# Patient Record
Sex: Female | Born: 1975 | Race: White | Hispanic: No | Marital: Single | Smoking: Never smoker
Health system: Southern US, Community
[De-identification: ages and names within clinical notes are randomized; demographics above are authoritative.]

---

## 2006-05-19 ENCOUNTER — Emergency Department: Payer: Self-pay | Admitting: Emergency Medicine

## 2010-09-25 ENCOUNTER — Emergency Department: Payer: Self-pay | Admitting: *Deleted

## 2011-05-31 ENCOUNTER — Ambulatory Visit: Payer: Self-pay | Admitting: Family Medicine

## 2016-08-23 ENCOUNTER — Other Ambulatory Visit: Payer: Self-pay | Admitting: Student

## 2016-08-23 DIAGNOSIS — Z1231 Encounter for screening mammogram for malignant neoplasm of breast: Secondary | ICD-10-CM

## 2016-09-22 ENCOUNTER — Ambulatory Visit
Admission: RE | Admit: 2016-09-22 | Discharge: 2016-09-22 | Disposition: A | Discharge status: Home / Self Care | Payer: 59 | Source: Ambulatory Visit | Attending: Student | Admitting: Student

## 2016-09-22 DIAGNOSIS — Z1231 Encounter for screening mammogram for malignant neoplasm of breast: Secondary | ICD-10-CM | POA: Diagnosis not present

## 2016-09-29 ENCOUNTER — Inpatient Hospital Stay
Admission: RE | Admit: 2016-09-29 | Discharge: 2016-09-29 | Disposition: A | Discharge status: Home / Self Care | Payer: Self-pay | Source: Ambulatory Visit | Attending: *Deleted | Admitting: *Deleted

## 2016-09-29 ENCOUNTER — Other Ambulatory Visit: Payer: Self-pay | Admitting: *Deleted

## 2016-09-29 DIAGNOSIS — Z9289 Personal history of other medical treatment: Secondary | ICD-10-CM

## 2017-09-13 ENCOUNTER — Other Ambulatory Visit: Payer: Self-pay | Admitting: Student

## 2017-09-13 DIAGNOSIS — Z1231 Encounter for screening mammogram for malignant neoplasm of breast: Secondary | ICD-10-CM

## 2017-09-28 ENCOUNTER — Ambulatory Visit
Admission: RE | Admit: 2017-09-28 | Discharge: 2017-09-28 | Disposition: A | Discharge status: Home / Self Care | Payer: Managed Care, Other (non HMO) | Source: Ambulatory Visit | Attending: Student | Admitting: Student

## 2017-09-28 DIAGNOSIS — Z1231 Encounter for screening mammogram for malignant neoplasm of breast: Secondary | ICD-10-CM | POA: Diagnosis not present

## 2017-09-30 ENCOUNTER — Other Ambulatory Visit: Payer: Self-pay | Admitting: Student

## 2017-09-30 DIAGNOSIS — R928 Other abnormal and inconclusive findings on diagnostic imaging of breast: Secondary | ICD-10-CM

## 2017-10-05 ENCOUNTER — Ambulatory Visit
Admission: RE | Admit: 2017-10-05 | Discharge: 2017-10-05 | Disposition: A | Discharge status: Home / Self Care | Payer: Managed Care, Other (non HMO) | Source: Ambulatory Visit | Attending: Student | Admitting: Student

## 2017-10-05 DIAGNOSIS — R928 Other abnormal and inconclusive findings on diagnostic imaging of breast: Secondary | ICD-10-CM

## 2018-07-08 ENCOUNTER — Other Ambulatory Visit: Payer: Self-pay

## 2018-07-08 ENCOUNTER — Emergency Department
Admission: EM | Admit: 2018-07-08 | Discharge: 2018-07-08 | Disposition: A | Discharge status: Home / Self Care | Payer: Managed Care, Other (non HMO) | Attending: Emergency Medicine | Admitting: Emergency Medicine

## 2018-07-08 ENCOUNTER — Encounter: Payer: Self-pay | Admitting: Emergency Medicine

## 2018-07-08 DIAGNOSIS — R42 Dizziness and giddiness: Secondary | ICD-10-CM | POA: Insufficient documentation

## 2018-07-08 DIAGNOSIS — Z5321 Procedure and treatment not carried out due to patient leaving prior to being seen by health care provider: Secondary | ICD-10-CM | POA: Insufficient documentation

## 2018-07-08 LAB — BASIC METABOLIC PANEL
Anion gap: 8 (ref 5–15)
BUN: 19 mg/dL (ref 6–20)
CO2: 24 mmol/L (ref 22–32)
Calcium: 8.8 mg/dL — ABNORMAL LOW (ref 8.9–10.3)
Chloride: 104 mmol/L (ref 98–111)
Creatinine, Ser: 0.76 mg/dL (ref 0.44–1.00)
GFR calc Af Amer: >60 mL/min (ref >60)
GFR calc non Af Amer: >60 mL/min (ref >60)
Glucose, Bld: 100 mg/dL — ABNORMAL HIGH (ref 70–99)
Potassium: 3.8 mmol/L (ref 3.5–5.1)
Sodium: 136 mmol/L (ref 135–145)

## 2018-07-08 LAB — URINALYSIS, COMPLETE (UACMP) WITH MICROSCOPIC
Bacteria, UA: NONE SEEN
Bilirubin Urine: NEGATIVE
Glucose, UA: NEGATIVE mg/dL
Hgb urine dipstick: NEGATIVE
Ketones, ur: NEGATIVE mg/dL
Leukocytes,Ua: NEGATIVE
Nitrite: NEGATIVE
Protein, ur: NEGATIVE mg/dL
Specific Gravity, Urine: 1.014 (ref 1.005–1.030)
pH: 6 (ref 5.0–8.0)

## 2018-07-08 LAB — CBC
HCT: 40.7 % (ref 36.0–46.0)
Hemoglobin: 13.1 g/dL (ref 12.0–15.0)
MCH: 28.2 pg (ref 26.0–34.0)
MCHC: 32.2 g/dL (ref 30.0–36.0)
MCV: 87.5 fL (ref 80.0–100.0)
Platelets: 395 10*3/uL (ref 150–400)
RBC: 4.65 MIL/uL (ref 3.87–5.11)
RDW: 13.1 % (ref 11.5–15.5)
WBC: 12.1 10*3/uL — ABNORMAL HIGH (ref 4.0–10.5)
nRBC: 0 % (ref 0.0–0.2)

## 2018-07-08 LAB — TROPONIN I: Troponin I: <0.03 ng/mL (ref <0.03)

## 2018-07-08 NOTE — ED Notes (Signed)
Called for room, no answer

## 2018-07-08 NOTE — ED Notes (Signed)
Called for room, not in waiting room.  

## 2018-07-08 NOTE — ED Notes (Signed)
Called for VS, not in waiting room.

## 2018-07-08 NOTE — ED Triage Notes (Addendum)
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 Pt reports when she was lightheaded she was also sweaty and felt like her arms were heavy. Pt denies any pain at this time. Pt states the sxs started on Thursday.

## 2019-04-16 ENCOUNTER — Other Ambulatory Visit: Payer: Self-pay | Admitting: Student

## 2019-04-16 DIAGNOSIS — Z1231 Encounter for screening mammogram for malignant neoplasm of breast: Secondary | ICD-10-CM

## 2019-04-19 ENCOUNTER — Ambulatory Visit
Admission: RE | Admit: 2019-04-19 | Discharge: 2019-04-19 | Disposition: A | Discharge status: Home / Self Care | Payer: Managed Care, Other (non HMO) | Source: Ambulatory Visit | Attending: Student | Admitting: Student

## 2019-04-19 DIAGNOSIS — Z1231 Encounter for screening mammogram for malignant neoplasm of breast: Secondary | ICD-10-CM | POA: Insufficient documentation

## 2020-05-22 ENCOUNTER — Other Ambulatory Visit: Payer: Self-pay | Admitting: Student

## 2020-05-22 DIAGNOSIS — Z1231 Encounter for screening mammogram for malignant neoplasm of breast: Secondary | ICD-10-CM

## 2020-06-03 ENCOUNTER — Other Ambulatory Visit: Payer: Self-pay

## 2020-06-03 ENCOUNTER — Ambulatory Visit
Admission: RE | Admit: 2020-06-03 | Discharge: 2020-06-03 | Disposition: A | Discharge status: Home / Self Care | Payer: Managed Care, Other (non HMO) | Source: Ambulatory Visit | Attending: Student | Admitting: Student

## 2020-06-03 DIAGNOSIS — Z1231 Encounter for screening mammogram for malignant neoplasm of breast: Secondary | ICD-10-CM | POA: Insufficient documentation

## 2020-06-03 IMAGING — MG MM DIGITAL SCREENING BILAT W/ TOMO AND CAD
8 series · 8 of 24 positions shown · non-contrast
Comparison: Previous exam(s).

CLINICAL DATA: Screening.

EXAM:
DIGITAL SCREENING BILATERAL MAMMOGRAM WITH TOMOSYNTHESIS AND CAD
TECHNIQUE: Bilateral screening digital craniocaudal and mediolateral oblique
mammograms were obtained. Bilateral screening digital breast
tomosynthesis was performed. The images were evaluated with
computer-aided detection.

[L MLO synth-2D]
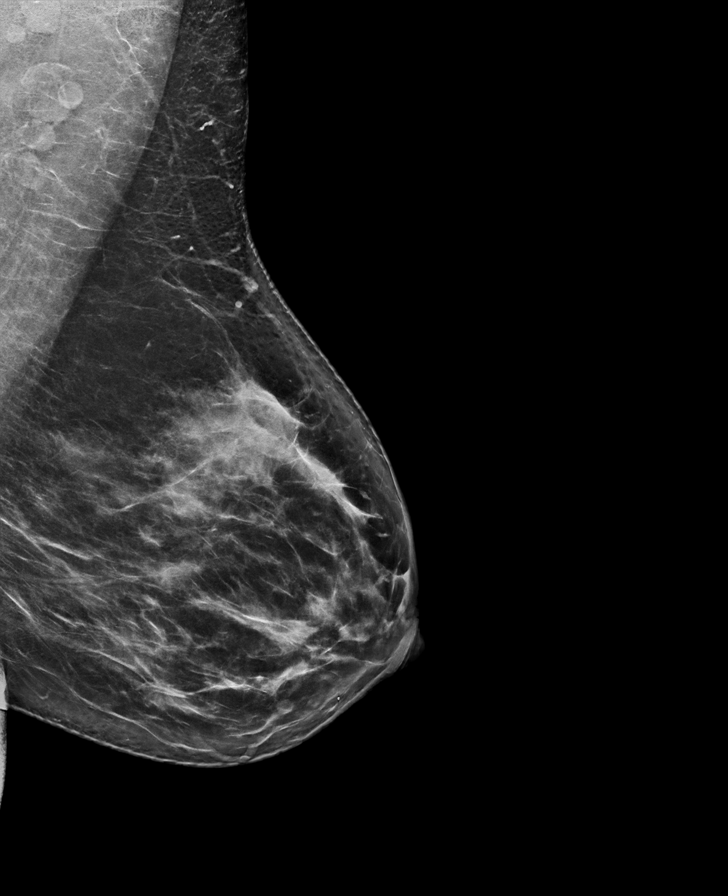

[R MLO synth-2D]
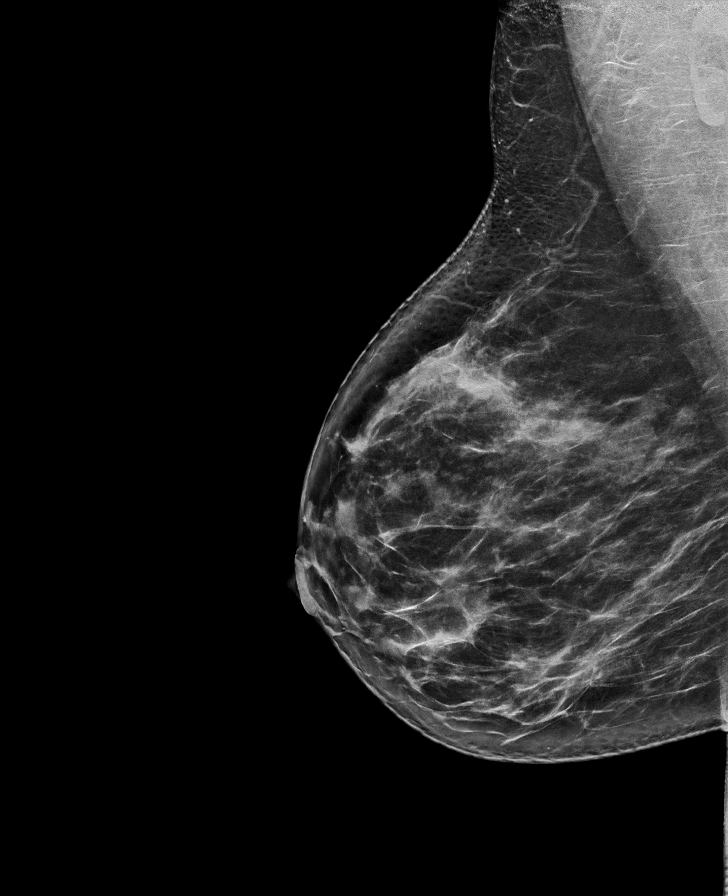

[R CC synth-2D]
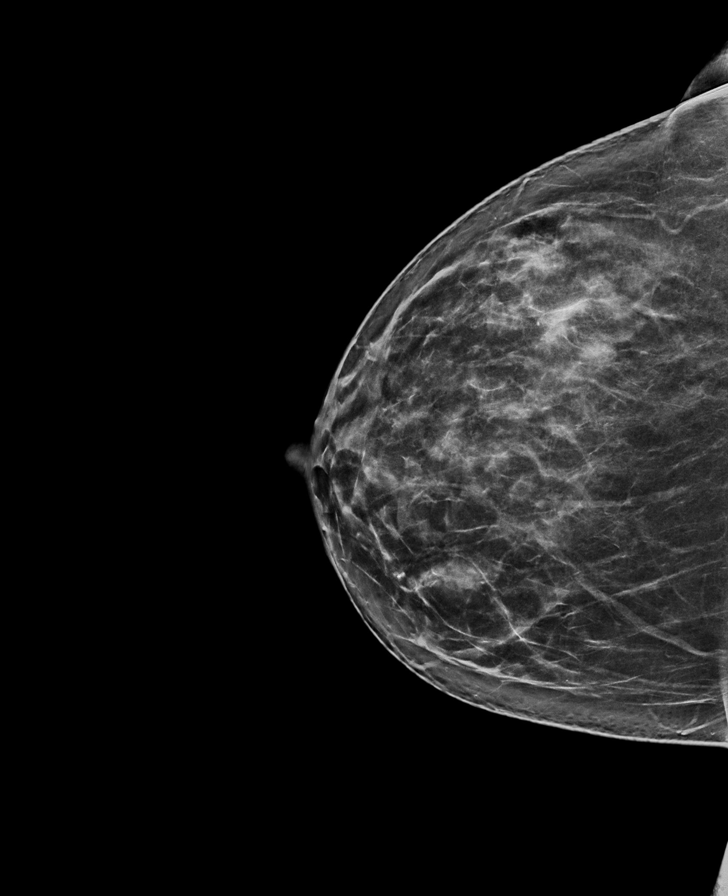

[L CC synth-2D]
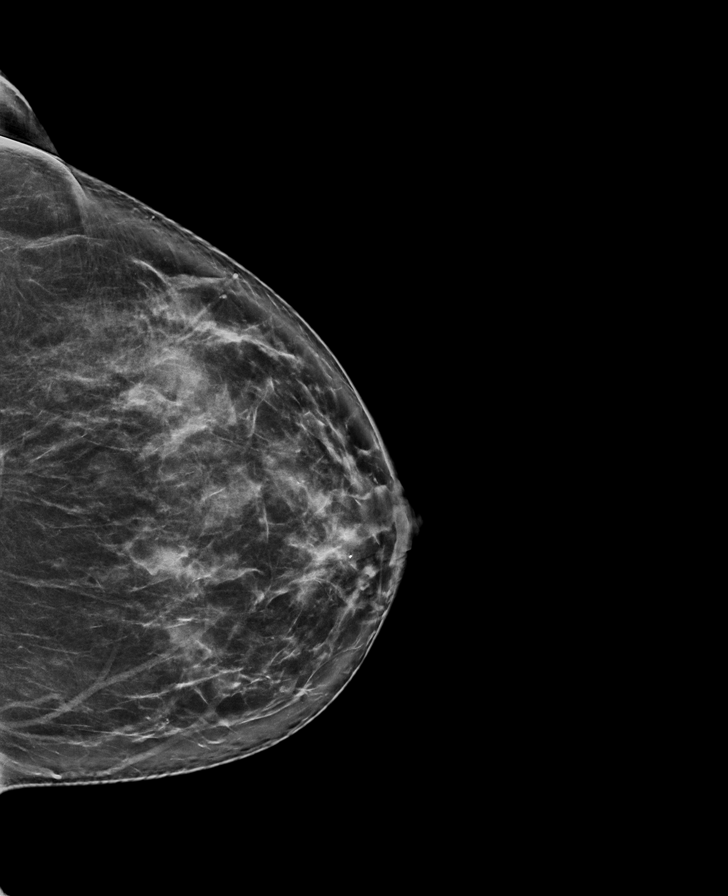

[R MLO tomo · tomo slice 43/85.0]
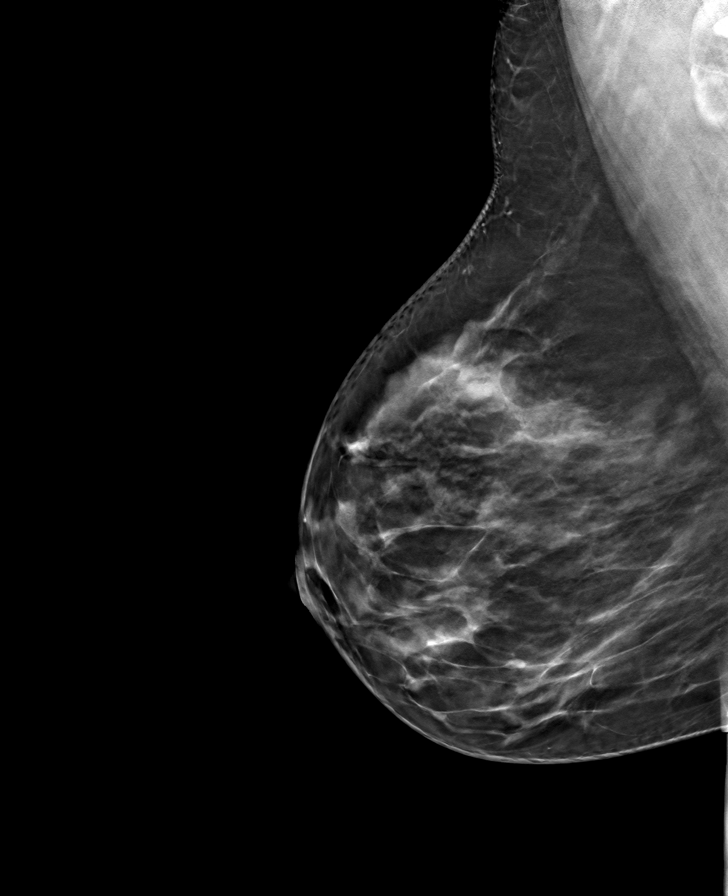

[R CC tomo · tomo slice 41/82.0]
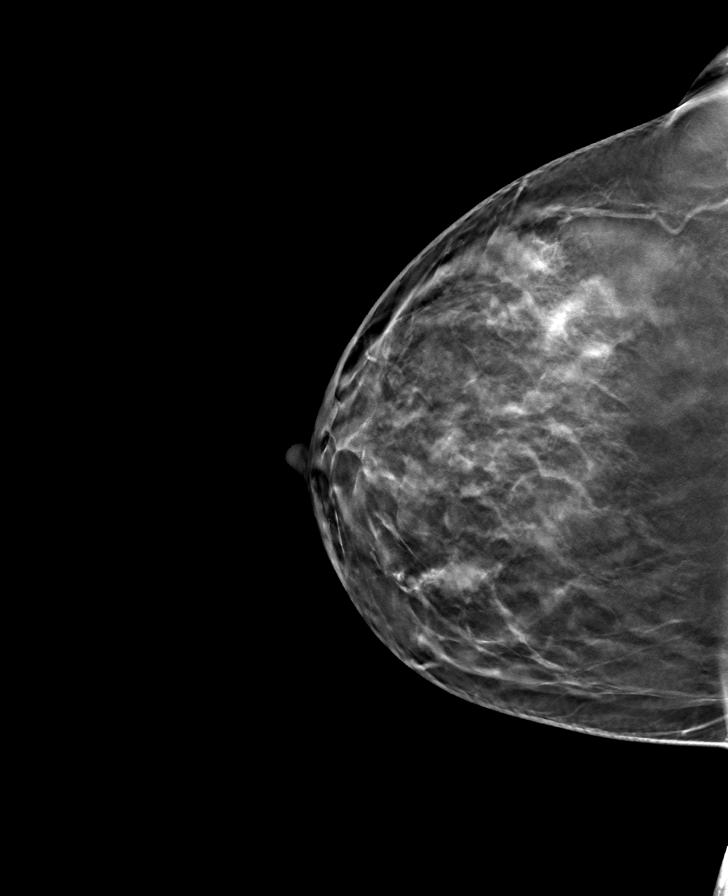

[L MLO tomo · tomo slice 43/85.0]
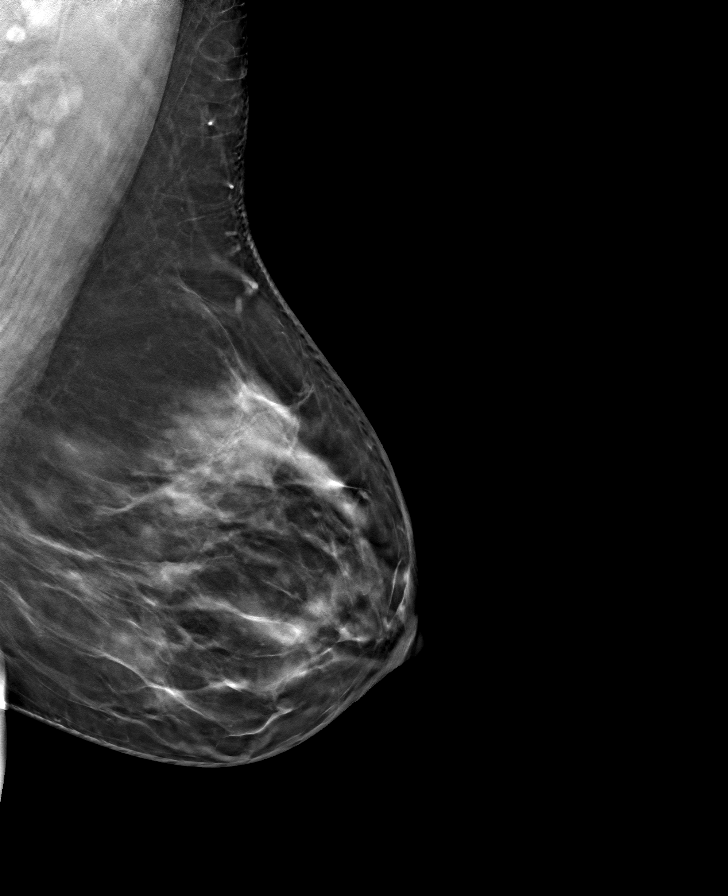

[L CC tomo · tomo slice 40/79.0]
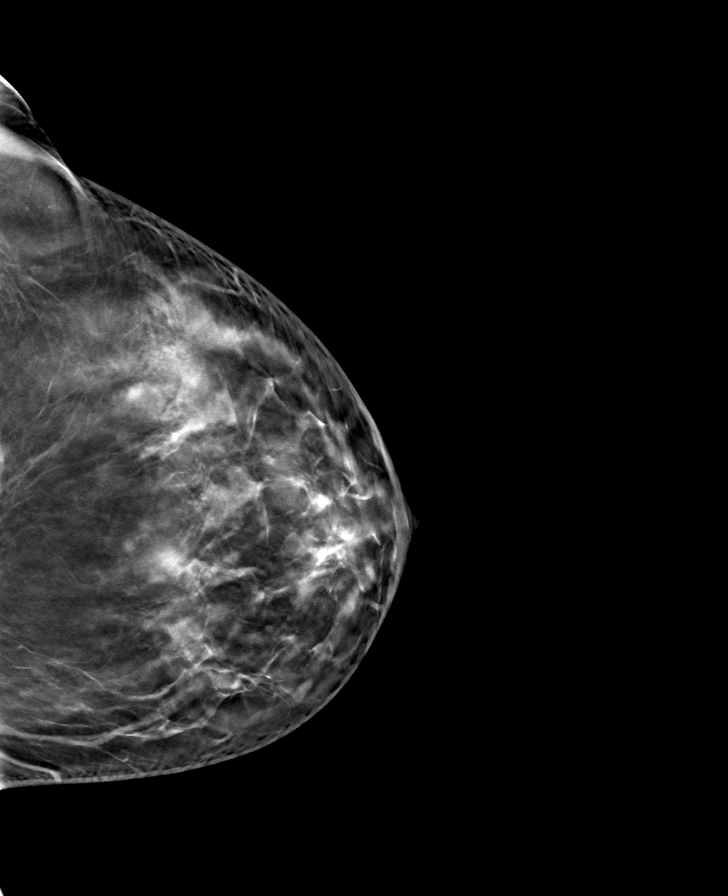

[8 of 24 positions shown; findings below may reference images not displayed]

ACR Breast Density Category c: The breast tissue is heterogeneously
dense, which may obscure small masses.
FINDINGS: There are no findings suspicious for malignancy. The images were
evaluated with computer-aided detection.
IMPRESSION: No mammographic evidence of malignancy. A result letter of this
screening mammogram will be mailed directly to the patient.

RECOMMENDATION:
Screening mammogram in one year. (Code:[6J])

BI-RADS CATEGORY  1: Negative.

## 2022-07-12 ENCOUNTER — Other Ambulatory Visit: Payer: Self-pay

## 2022-07-12 ENCOUNTER — Encounter: Payer: Self-pay | Admitting: Neurology

## 2022-07-12 DIAGNOSIS — R202 Paresthesia of skin: Secondary | ICD-10-CM

## 2022-07-26 ENCOUNTER — Ambulatory Visit: Payer: Managed Care, Other (non HMO) | Admitting: Neurology

## 2022-07-26 DIAGNOSIS — R202 Paresthesia of skin: Secondary | ICD-10-CM

## 2022-07-26 DIAGNOSIS — G5603 Carpal tunnel syndrome, bilateral upper limbs: Secondary | ICD-10-CM

## 2022-07-26 NOTE — Procedures (Signed)
[facility]  [address] [place], [address]  [place], [place] [address] Tel: [phone] Fax: [phone] Test Date:  07/26/2022  Patient: Linda Carpenter DOB: Nov 01, 1975 Physician: Jacquelyne Balint, MD  Sex: Female Height: 5\' 5"  Ref Phys: Margart Sickles, PA-C  ID#: [id number]   Technician:    History: This is a 47 year old female with numbness and tingling in her hands.  NCV & EMG Findings: Extensive electrodiagnostic evaluation of bilateral upper limbs shows: Right median sensory response shows prolonged distal peak latency (4.4 ms) and reduced amplitude (15 V). Left median-ulnar palmar sensory response shows prolonged median palmar distal peak latency (2.3 ms) and abnormal median-ulnar palmar latency difference (0.87 ms). Left median, bilateral ulnar, and bilateral radial sensory responses are within normal limits. Bilateral median (APB) and ulnar (ADM) motor responses are within normal limits. Chronic motor axon loss changes without accompanying active denervation changes are seen in the left first dorsal interosseous muscle. All other tested muscles are within normal limits with normal motor unit configuration and recruitment patterns.  Impression: This is an abnormal study. The findings are most consistent with the following: Evidence of bilateral median mononeuropathy at or distal to the wrist, consistent with carpal tunnel syndrome. The findings are moderate in degree electrically on the right and very mild in degree electrically on the left. No definitive electrodiagnostic evidence of a right or left cervical (C5-C8) motor radiculopathy. Screening studies for right or left ulnar or radial mononeuropathies are normal. Minimal chronic neurogenic changes isolated to the left first dorsal interosseous muscle are seen on needle examination, with no active or ongoing changes, that is of unclear significance. This finding is too limited in degree and distribution for diagnostic  purposes.    ___________________________ Jacquelyne Balint, MD    Nerve Conduction Studies Motor Nerve Results    Latency Amplitude F-Lat Segment Distance CV Comment  Site (ms) Norm (mV) Norm (ms)  (cm) (m/s) Norm   Left Median (APB) Motor  Wrist 2.3  < 3.9 10.3  > 6.0        Elbow 6.5 - 9.8 -  Elbow-Wrist 27.5 65  > 50   Right Median (APB) Motor  Wrist 3.5  < 3.9 8.4  > 6.0        Elbow 7.8 - 7.7 -  Elbow-Wrist 25 58  > 50   Left Ulnar (ADM) Motor  Wrist 1.40  < 3.1 9.7  > 7.0        Bel elbow 4.4 - 8.5 -  Bel elbow-Wrist 20 67  > 50   Ab elbow 5.9 - 8.4 -  Ab elbow-Bel elbow 10 67 -   Right Ulnar (ADM) Motor  Wrist 1.40  < 3.1 9.3  > 7.0        Bel elbow 4.4 - 8.7 -  Bel elbow-Wrist 20 67  > 50   Ab elbow 6.0 - 8.6 -  Ab elbow-Bel elbow 10 63 -    Sensory Sites    Neg Peak Lat Amplitude (O-P) Segment Distance Velocity Comment  Site (ms) Norm (V) Norm  (cm) (ms)   Left Median Sensory  Wrist-Dig II 3.2  < 3.4 49  > 20 Wrist-Dig II 13    Right Median Sensory  Wrist *4.4  < 3.4 *15  > 20 Wrist-Dig II 13    Left Median-Ulnar Palmar Sensory       Median  Palm-Wrist *2.3  < 2.2 107  > 10 Palm-Wrist 8  Ulnar  Palm-Wrist 1.43  < 2.2 13  > 5 Palm-Wrist 8    Left Radial Sensory  Forearm-Wrist 1.83  < 2.7 33  > 18 Forearm-Wrist 10    Right Radial Sensory  Forearm-Wrist 1.85  < 2.7 32  > 18 Forearm-Wrist 10    Left Ulnar Sensory  Wrist-Dig V 2.4  < 3.1 34  > 12 Wrist-Dig V 11    Right Ulnar Sensory  Wrist-Dig V 2.2  < 3.1 34  > 12 Wrist-Dig V 11     Inter-Nerve Comparisons   Nerve 1 Value 1 Nerve 2 Value 2 Parameter Result Normal  Sensory Sites  L Median Palm-Wrist 2.3 ms L Ulnar Palm-Wrist 1.43 ms Peak Lat Diff *0.87 ms <0.40   Electromyography   Side Muscle Ins.Act Fibs Fasc Recrt Amp Dur Poly Activation Comment  Left FDI Nml Nml Nml *1- *1+ *1+ *1+ Nml N/A  Left EIP Nml Nml Nml Nml Nml Nml Nml Nml N/A  Left FPL Nml Nml Nml Nml Nml Nml Nml Nml N/A  Left  Pronator teres Nml Nml Nml Nml Nml Nml Nml Nml N/A  Left Biceps Nml Nml Nml Nml Nml Nml Nml Nml N/A  Left Triceps Nml Nml Nml Nml Nml Nml Nml Nml N/A  Left Deltoid Nml Nml Nml Nml Nml Nml Nml Nml N/A  Right FDI Nml Nml Nml Nml Nml Nml Nml Nml N/A  Right EIP Nml Nml Nml Nml Nml Nml Nml Nml N/A  Right Pronator teres Nml Nml Nml Nml Nml Nml Nml Nml N/A  Right Biceps Nml Nml Nml Nml Nml Nml Nml Nml N/A  Right Triceps Nml Nml Nml Nml Nml Nml Nml Nml N/A  Right Deltoid Nml Nml Nml Nml Nml Nml Nml Nml N/A      Waveforms:  Motor           Sensory

## 2022-12-29 ENCOUNTER — Other Ambulatory Visit: Payer: Self-pay | Admitting: Student

## 2022-12-29 DIAGNOSIS — Z1231 Encounter for screening mammogram for malignant neoplasm of breast: Secondary | ICD-10-CM

## 2023-01-25 ENCOUNTER — Ambulatory Visit
Admission: RE | Admit: 2023-01-25 | Discharge: 2023-01-25 | Disposition: A | Discharge status: Home / Self Care | Payer: Managed Care, Other (non HMO) | Source: Ambulatory Visit | Attending: Student | Admitting: Student

## 2023-01-25 DIAGNOSIS — Z1231 Encounter for screening mammogram for malignant neoplasm of breast: Secondary | ICD-10-CM | POA: Diagnosis present

## 2023-01-31 ENCOUNTER — Other Ambulatory Visit: Payer: Self-pay | Admitting: Student

## 2023-01-31 DIAGNOSIS — R928 Other abnormal and inconclusive findings on diagnostic imaging of breast: Secondary | ICD-10-CM

## 2023-02-04 ENCOUNTER — Ambulatory Visit
Admission: RE | Admit: 2023-02-04 | Discharge: 2023-02-04 | Disposition: A | Discharge status: Home / Self Care | Payer: Managed Care, Other (non HMO) | Source: Ambulatory Visit | Attending: Student | Admitting: Student

## 2023-02-04 DIAGNOSIS — R928 Other abnormal and inconclusive findings on diagnostic imaging of breast: Secondary | ICD-10-CM | POA: Diagnosis present

## 2023-03-30 ENCOUNTER — Emergency Department: Payer: BC Managed Care – PPO

## 2023-03-30 ENCOUNTER — Encounter: Payer: Self-pay | Admitting: *Deleted

## 2023-03-30 ENCOUNTER — Other Ambulatory Visit: Payer: Self-pay

## 2023-03-30 ENCOUNTER — Emergency Department
Admission: EM | Admit: 2023-03-30 | Discharge: 2023-03-30 | Disposition: A | Discharge status: Home / Self Care | Payer: BC Managed Care – PPO | Attending: Emergency Medicine | Admitting: Emergency Medicine

## 2023-03-30 DIAGNOSIS — R7303 Prediabetes: Secondary | ICD-10-CM | POA: Insufficient documentation

## 2023-03-30 DIAGNOSIS — R0789 Other chest pain: Secondary | ICD-10-CM | POA: Diagnosis not present

## 2023-03-30 DIAGNOSIS — J45909 Unspecified asthma, uncomplicated: Secondary | ICD-10-CM | POA: Insufficient documentation

## 2023-03-30 DIAGNOSIS — R079 Chest pain, unspecified: Secondary | ICD-10-CM

## 2023-03-30 LAB — BASIC METABOLIC PANEL
Anion gap: 11 (ref 5–15)
BUN: 27 mg/dL — ABNORMAL HIGH (ref 6–20)
CO2: 26 mmol/L (ref 22–32)
Calcium: 9.2 mg/dL (ref 8.9–10.3)
Chloride: 103 mmol/L (ref 98–111)
Creatinine, Ser: 1.08 mg/dL — ABNORMAL HIGH (ref 0.44–1.00)
GFR, Estimated: >60 mL/min (ref >60)
Glucose, Bld: 132 mg/dL — ABNORMAL HIGH (ref 70–99)
Potassium: 3.7 mmol/L (ref 3.5–5.1)
Sodium: 140 mmol/L (ref 135–145)

## 2023-03-30 LAB — TROPONIN I (HIGH SENSITIVITY): Troponin I (High Sensitivity): 5 ng/L (ref <18)

## 2023-03-30 LAB — CBC
HCT: 44.9 % (ref 36.0–46.0)
Hemoglobin: 15 g/dL (ref 12.0–15.0)
MCH: 30.7 pg (ref 26.0–34.0)
MCHC: 33.4 g/dL (ref 30.0–36.0)
MCV: 91.8 fL (ref 80.0–100.0)
Platelets: 344 10*3/uL (ref 150–400)
RBC: 4.89 MIL/uL (ref 3.87–5.11)
RDW: 12.8 % (ref 11.5–15.5)
WBC: 11.8 10*3/uL — ABNORMAL HIGH (ref 4.0–10.5)
nRBC: 0 % (ref 0.0–0.2)

## 2023-03-30 NOTE — Discharge Instructions (Addendum)
Been diagnosed with pain in the chest.  Due to your family history please make an appointment with cardiology for a follow-up.  You can come back to ED or go to your PCP if you have new symptoms or symptoms worsen.  Please drink plenty of fluids, use electrolytes.

## 2023-03-30 NOTE — ED Triage Notes (Addendum)
Pt ambulatory to triage.  Pt reports chest pain radiating into back and right jaw.  No n/v/d/  no diaphoresis.   No sob.  Non smoker  no cough Sx began at 1900   pt alert  speech clear.

## 2023-03-30 NOTE — ED Provider Notes (Signed)
[facility] Provider Note    None    (approximate)   History   Chest Pain   HPI  Verlaine L Kamps is a 48 y.o. female who presents today with right chest pain that radiated to the back.  Patient states was the first episode.  Patient states that she had tacos 1 hour before having the pain, and the sensation  uncomfortable.  She  states pain  radiated to the right jaw.  Patient is concerned her because her mom died from cardiomyopathy.  Has history of asthma that is well-controlled   Physical Exam   Triage Vital Signs: ED Triage Vitals  Encounter Vitals Group     BP 03/30/23 2048 (!) 167/101     Systolic BP Percentile --      Diastolic BP Percentile --      Pulse Rate 03/30/23 2048 89     Resp 03/30/23 2048 18     Temp 03/30/23 2048 97.8 F (36.6 C)     Temp Source 03/30/23 2048 Oral     SpO2 03/30/23 2048 97 %     Weight 03/30/23 2045 215 lb (97.5 kg)     Height 03/30/23 2045 5\' 5"  (1.651 m)     Head Circumference --      Peak Flow --      Pain Score 03/30/23 2045 4     Pain Loc --      Pain Education --      Exclude from Growth Chart --     Most recent vital signs: Vitals:   03/30/23 2048  BP: (!) 167/101  Pulse: 89  Resp: 18  Temp: 97.8 F (36.6 C)  SpO2: 97%     Constitutional: Alert, nondistressed Eyes: Conjunctivae are normal.  Head: Atraumatic. Nose: No congestion/rhinnorhea. Mouth/Throat: Mucous membranes are moist.   Neck: Painless ROM.  Cardiovascular:   Good peripheral circulation.  Regular rhythm Respiratory: Normal respiratory effort.  No retractions.  No wheezing Gastrointestinal: Soft and nontender.  Musculoskeletal:  no deformity Neurologic:  MAE spontaneously. No gross focal neurologic deficits are appreciated.  Skin:  Skin is warm, dry and intact. No rash noted. Psychiatric: Mood and affect are normal. Speech and behavior are normal.    ED Results / Procedures / Treatments   Labs (all labs ordered are  listed, but only abnormal results are displayed) Labs Reviewed  BASIC METABOLIC PANEL - Abnormal; Notable for the following components:      Result Value   Glucose, Bld 132 (*)    BUN 27 (*)    Creatinine, Ser 1.08 (*)    All other components within normal limits  CBC - Abnormal; Notable for the following components:   WBC 11.8 (*)    All other components within normal limits  TROPONIN I (HIGH SENSITIVITY)  TROPONIN I (HIGH SENSITIVITY)     EKG See physician read    RADIOLOGY I independently reviewed and interpreted imaging and agree with radiologists findings.      PROCEDURES:  Critical Care performed:   Procedures   MEDICATIONS ORDERED IN ED: Medications - No data to display   IMPRESSION / MDM / ASSESSMENT AND PLAN / ED COURSE  I reviewed the triage vital signs and the nursing notes.  Differential diagnosis includes, but is not limited to, pneumonia, myocardial infarction, pancreatitis, GERD  Patient's presentation is most consistent with acute complicated illness / injury requiring diagnostic workup.   Patient's diagnosis is consistent with chest pain, GERD. I  independently reviewed and interpreted imaging and agree with radiologists findings. Labs are reassuring. I did review the patient's allergies and medications. Patient will be discharged home with prescription  Patient is to follow up with cardiology and PCP as needed or otherwise directed. Patient is given ED precautions to return to the ED for any worsening or new symptoms. Discussed plan of care with patient, answered all of patient's questions, Patient agreeable to plan of care. . Patient verbalized understanding. Clinical Course as of 03/30/23 2254  Wed Mar 30, 2023  2210 DG Chest 2 View No active cardiopulmonary disease. [AE]  2210 Troponin I (High Sensitivity) Within normal limits [AE]  2210 Basic metabolic panel(!) Electrolytes within normal limits anion gap within normal limits BUN and  creatinine mild elevation [AE]  2210 CBC(!) White blood cells elevated 11.8 [AE]    Clinical Course User Index [AE] Gladys Damme, PA-C     FINAL CLINICAL IMPRESSION(S) / ED DIAGNOSES   Final diagnoses:  Nonspecific chest pain  Prediabetes     Rx / DC Orders   ED Discharge Orders     None        Note:  This document was prepared using Dragon voice recognition software and may include unintentional dictation errors.   Gladys Damme, PA-C 03/30/23 2255    Merwyn Katos, MD 03/30/23 [phone]

## 2023-08-25 ENCOUNTER — Ambulatory Visit: Payer: Self-pay

## 2023-08-25 DIAGNOSIS — K640 First degree hemorrhoids: Secondary | ICD-10-CM | POA: Diagnosis not present

## 2023-08-25 DIAGNOSIS — D120 Benign neoplasm of cecum: Secondary | ICD-10-CM | POA: Diagnosis not present

## 2023-08-25 DIAGNOSIS — D125 Benign neoplasm of sigmoid colon: Secondary | ICD-10-CM | POA: Diagnosis not present

## 2023-08-25 DIAGNOSIS — Z1211 Encounter for screening for malignant neoplasm of colon: Secondary | ICD-10-CM | POA: Diagnosis present
